# Patient Record
Sex: Male | Born: 1955 | Race: White | Hispanic: No | Marital: Married | Smoking: Never smoker
Health system: Southern US, Community
[De-identification: ages and names within clinical notes are randomized; demographics above are authoritative.]

## PROBLEM LIST (undated history)

## (undated) HISTORY — PX: HERNIA REPAIR: SHX51

## (undated) HISTORY — PX: SHOULDER ARTHROSCOPY W/ ROTATOR CUFF REPAIR: SHX2400

---

## 2008-05-29 ENCOUNTER — Ambulatory Visit: Payer: Self-pay | Admitting: Family Medicine

## 2014-09-06 ENCOUNTER — Encounter: Payer: Self-pay | Admitting: Emergency Medicine

## 2014-09-06 ENCOUNTER — Emergency Department: Payer: BC Managed Care – PPO

## 2014-09-06 ENCOUNTER — Emergency Department
Admission: EM | Admit: 2014-09-06 | Discharge: 2014-09-06 | Disposition: A | Discharge status: Home / Self Care | Payer: BC Managed Care – PPO | Attending: Emergency Medicine | Admitting: Emergency Medicine

## 2014-09-06 DIAGNOSIS — Y9389 Activity, other specified: Secondary | ICD-10-CM | POA: Insufficient documentation

## 2014-09-06 DIAGNOSIS — S93601A Unspecified sprain of right foot, initial encounter: Secondary | ICD-10-CM | POA: Diagnosis not present

## 2014-09-06 DIAGNOSIS — Y9289 Other specified places as the place of occurrence of the external cause: Secondary | ICD-10-CM | POA: Diagnosis not present

## 2014-09-06 DIAGNOSIS — S99921A Unspecified injury of right foot, initial encounter: Secondary | ICD-10-CM | POA: Diagnosis present

## 2014-09-06 DIAGNOSIS — Y998 Other external cause status: Secondary | ICD-10-CM | POA: Insufficient documentation

## 2014-09-06 DIAGNOSIS — X58XXXA Exposure to other specified factors, initial encounter: Secondary | ICD-10-CM | POA: Diagnosis not present

## 2014-09-06 MED ORDER — NAPROXEN 500 MG PO TABS: 500.0000 mg | ORAL_TABLET | Freq: Two times a day (BID) | ORAL | Status: AC

## 2014-09-06 NOTE — Discharge Instructions (Signed)
Foot Sprain The muscles and cord like structures which attach muscle to bone (tendons) that surround the feet are made up of units. A foot sprain can occur at the weakest spot in any of these units. This condition is most often caused by injury to or overuse of the foot, as from playing contact sports, or aggravating a previous injury, or from poor conditioning, or obesity. SYMPTOMS  Pain with movement of the foot.  Tenderness and swelling at the injury site.  Loss of strength is present in moderate or severe sprains. THE THREE GRADES OR SEVERITY OF FOOT SPRAIN ARE:  Mild (Grade I): Slightly pulled muscle without tearing of muscle or tendon fibers or loss of strength.  Moderate (Grade II): Tearing of fibers in a muscle, tendon, or at the attachment to bone, with small decrease in strength.  Severe (Grade III): Rupture of the muscle-tendon-bone attachment, with separation of fibers. Severe sprain requires surgical repair. Often repeating (chronic) sprains are caused by overuse. Sudden (acute) sprains are caused by direct injury or over-use. DIAGNOSIS  Diagnosis of this condition is usually by your own observation. If problems continue, a caregiver may be required for further evaluation and treatment. X-rays may be required to make sure there are not breaks in the bones (fractures) present. Continued problems may require physical therapy for treatment. PREVENTION  Use strength and conditioning exercises appropriate for your sport.  Warm up properly prior to working out.  Use athletic shoes that are made for the sport you are participating in.  Allow adequate time for healing. Early return to activities makes repeat injury more likely, and can lead to an unstable arthritic foot that can result in prolonged disability. Mild sprains generally heal in 3 to 10 days, with moderate and severe sprains taking 2 to 10 weeks. Your caregiver can help you determine the proper time required for  healing. HOME CARE INSTRUCTIONS   Apply ice to the injury for 15-20 minutes, 03-04 times per day. Put the ice in a plastic bag and place a towel between the bag of ice and your skin.  An elastic wrap (like an Ace bandage) may be used to keep swelling down.  Keep foot above the level of the heart, or at least raised on a footstool, when swelling and pain are present.  Try to avoid use other than gentle range of motion while the foot is painful. Do not resume use until instructed by your caregiver. Then begin use gradually, not increasing use to the point of pain. If pain does develop, decrease use and continue the above measures, gradually increasing activities that do not cause discomfort, until you gradually achieve normal use.  Use crutches if and as instructed, and for the length of time instructed.  Keep injured foot and ankle wrapped between treatments.  Massage foot and ankle for comfort and to keep swelling down. Massage from the toes up towards the knee.  Only take over-the-counter or prescription medicines for pain, discomfort, or fever as directed by your caregiver. SEEK IMMEDIATE MEDICAL CARE IF:   Your pain and swelling increase, or pain is not controlled with medications.  You have loss of feeling in your foot or your foot turns cold or blue.  You develop new, unexplained symptoms, or an increase of the symptoms that brought you to your caregiver. MAKE SURE YOU:   Understand these instructions.  Will watch your condition.  Will get help right away if you are not doing well or get worse. Document Released:   08/25/2001 Document Revised: 05/28/2011 Document Reviewed: 10/23/2007 Exit[employer] Patient Information 2015 [employer]. This information is not intended to replace advice given to you by your health care provider. Make sure you discuss any questions you have with your health care provider.  

## 2014-09-06 NOTE — ED Provider Notes (Signed)
[facility] Emergency Department Provider Note  ____________________________________________  Time seen: Approximately 8:22 AM  I have reviewed the triage vital signs and the nursing notes.   HISTORY  Chief Complaint Foot Pain    HPI Burwell Hoage is a 59 y.o. male plane of right foot pain secondary stepped in hole 2 days ago. Patient state bleeding might a broken foot secondary to his complaint of pain and swelling. Patient is rating his pain as a 3/10. Decreased ambulation.   No past medical history on file.  There are no active problems to display for this patient.   No past surgical history on file.  Current Outpatient Rx  Name  Route  Sig  Dispense  Refill  . loratadine (CLARITIN) 10 MG tablet   Oral   Take 10 mg by mouth daily.         . naproxen (NAPROSYN) 500 MG tablet   Oral   Take 1 tablet (500 mg total) by mouth 2 (two) times daily with a meal.   20 tablet   00     Allergies Tetracyclines & related  No family history on file.  Social History History  Substance Use Topics  . Smoking status: Never Smoker   . Smokeless tobacco: Never Used  . Alcohol Use: Yes    Review of Systems Constitutional: No fever/chills Eyes: No visual changes. ENT: No sore throat. Cardiovascular: Denies chest pain. Respiratory: Denies shortness of breath. Gastrointestinal: No abdominal pain.  No nausea, no vomiting.  No diarrhea.  No constipation. Genitourinary: Negative for dysuria. Musculoskeletal: Right lateral foot pain. Skin: Negative for rash. Neurological: Negative for headaches, focal weakness or numbness. 10-point ROS otherwise negative.  ____________________________________________   PHYSICAL EXAM:  VITAL SIGNS: ED Triage Vitals  Enc Vitals Group     BP 09/06/14 0724 141/81 mmHg     Pulse Rate 09/06/14 0724 84     Resp 09/06/14 0724 18     Temp 09/06/14 0724 97.4 F (36.3 C)     Temp Source 09/06/14 0724 Oral     SpO2  09/06/14 0724 98 %     Weight 09/06/14 0724 200 lb (90.719 kg)     Height 09/06/14 0724 5\' 11"  (1.803 m)     Head Cir --      Peak Flow --      Pain Score 09/06/14 0728 3     Pain Loc --      Pain Edu? --      Excl. in GC? --    Constitutional: Alert and oriented. Well appearing and in no acute distress. Eyes: Conjunctivae are normal. PERRL. EOMI. Head: Atraumatic. Nose: No congestion/rhinnorhea. Mouth/Throat: Mucous membranes are moist.  Oropharynx non-erythematous. Neck: No stridor.  Full nuchal range of motion no deformity nontender palpation. Hematological/Lymphatic/Immunilogical: No cervical lymphadenopathy. Cardiovascular: Normal rate, regular rhythm. Grossly normal heart sounds.  Good peripheral circulation. Respiratory: Normal respiratory effort.  No retractions. Lungs CTAB. Gastrointestinal: Soft and nontender. No distention. No abdominal bruits. No CVA tenderness. Musculoskeletal: No deformity to the right foot. Mild edema but no erythema for nuchal range of motion patient able to weight-bear. Neurologic:  Normal speech and language. No gross focal neurologic deficits are appreciated. Speech is normal. No gait instability. Skin:  Skin is warm, dry and intact. No rash noted. Psychiatric: Mood and affect are normal. Speech and behavior are normal.  ____________________________________________   LABS (all labs ordered are listed, but only abnormal results are displayed)  Labs Reviewed - No  data to display ____________________________________________  EKG   ____________________________________________  RADIOLOGY  No acute findings. ____________________________________________   PROCEDURES  Procedure(s) performed: None  Critical Care performed: No  ____________________________________________   INITIAL IMPRESSION / ASSESSMENT AND PLAN / ED COURSE  Pertinent labs & imaging results that were available during my care of the patient were reviewed by me and  considered in my medical decision making (see chart for details).  Sprain right foot. ____________________________________________   FINAL CLINICAL IMPRESSION(S) / ED DIAGNOSES  Final diagnoses:  Sprain of right foot, initial encounter      Joni Reining, PA-C 09/06/14 0831  Sharman Cheek, MD 09/06/14 [phone]

## 2014-09-06 NOTE — ED Notes (Signed)
Patient reports stepped in hole yesterday and believes he may have broken a bone in his foot, reports pain to outer portion of foot.

## 2016-08-27 ENCOUNTER — Encounter: Payer: Self-pay | Admitting: Emergency Medicine

## 2016-08-27 ENCOUNTER — Ambulatory Visit
Admission: EM | Admit: 2016-08-27 | Discharge: 2016-08-27 | Disposition: A | Discharge status: Home / Self Care | Payer: BC Managed Care – PPO | Attending: Family Medicine | Admitting: Family Medicine

## 2016-08-27 DIAGNOSIS — R69 Illness, unspecified: Secondary | ICD-10-CM

## 2016-08-27 DIAGNOSIS — R5383 Other fatigue: Secondary | ICD-10-CM

## 2016-08-27 DIAGNOSIS — J111 Influenza due to unidentified influenza virus with other respiratory manifestations: Secondary | ICD-10-CM

## 2016-08-27 DIAGNOSIS — R509 Fever, unspecified: Secondary | ICD-10-CM | POA: Diagnosis not present

## 2016-08-27 DIAGNOSIS — M791 Myalgia: Secondary | ICD-10-CM

## 2016-08-27 LAB — RAPID STREP SCREEN (MED CTR MEBANE ONLY): Streptococcus, Group A Screen (Direct): NEGATIVE

## 2016-08-27 MED ORDER — OSELTAMIVIR PHOSPHATE 75 MG PO CAPS: 75.0000 mg | ORAL_CAPSULE | Freq: Two times a day (BID) | ORAL | 0 refills | Status: DC

## 2016-08-27 MED ORDER — ONDANSETRON 8 MG PO TBDP: 8.0000 mg | ORAL_TABLET | Freq: Three times a day (TID) | ORAL | 0 refills | Status: AC | PRN

## 2016-08-27 NOTE — ED Triage Notes (Signed)
Patient states he developed a fever yesterday is achy today and no appetite.

## 2016-08-27 NOTE — ED Provider Notes (Signed)
[facility]    CSN: [id number] Arrival date & time: 08/27/16  0810     History   Chief Complaint Chief Complaint  Patient presents with  . Fever    HPI James Farmer is a 61 y.o. male.   The history is provided by the patient.  Fever  Associated symptoms: myalgias   URI  Presenting symptoms: fatigue and fever   Severity:  Moderate Onset quality:  Sudden Duration:  1 day Timing:  Constant Progression:  Worsening Chronicity:  New Relieved by:  OTC medications (tylenol) Worsened by:  Nothing Associated symptoms: myalgias   Risk factors: sick contacts   Risk factors: not elderly, no chronic cardiac disease, no chronic kidney disease, no chronic respiratory disease, no diabetes mellitus, no immunosuppression, no recent illness and no recent travel     History reviewed. No pertinent past medical history.  There are no active problems to display for this patient.   Past Surgical History:  Procedure Laterality Date  . HERNIA REPAIR    . SHOULDER ARTHROSCOPY W/ ROTATOR CUFF REPAIR         Home Medications    Prior to Admission medications   Medication Sig Start Date End Date Taking? Authorizing Provider  naproxen (NAPROSYN) 500 MG tablet Take 1 tablet (500 mg total) by mouth 2 (two) times daily with a meal. 09/06/14  Yes Joni Reining, PA-C  loratadine (CLARITIN) 10 MG tablet Take 10 mg by mouth daily.    [provider]  ondansetron (ZOFRAN ODT) 8 MG disintegrating tablet Take 1 tablet (8 mg total) by mouth every 8 (eight) hours as needed. 08/27/16   Payton Mccallum, MD  oseltamivir (TAMIFLU) 75 MG capsule Take 1 capsule (75 mg total) by mouth 2 (two) times daily. 08/27/16   Payton Mccallum, MD    Family History Family History  Problem Relation Age of Onset  . Heart failure Mother   . Stroke Father   . Cancer Neg Hx     Social History Social History  Substance Use Topics  . Smoking status: Never Smoker  . Smokeless tobacco: Never Used    . Alcohol use Yes     Allergies   Tetracyclines & related   Review of Systems Review of Systems  Constitutional: Positive for fatigue and fever.  Musculoskeletal: Positive for myalgias.     Physical Exam Triage Vital Signs ED Triage Vitals  Enc Vitals Group     BP 08/27/16 0904 120/72     Pulse Rate 08/27/16 0904 80     Resp 08/27/16 0904 18     Temp 08/27/16 0904 98 F (36.7 C)     Temp Source 08/27/16 0904 Oral     SpO2 08/27/16 0904 97 %     Weight 08/27/16 0907 204 lb 9.6 oz (92.8 kg)     Height 08/27/16 0907 5\' 11"  (1.803 m)     Head Circumference --      Peak Flow --      Pain Score 08/27/16 0907 2     Pain Loc --      Pain Edu? --      Excl. in GC? --    No data found.   Updated Vital Signs BP 120/72   Pulse 80   Temp 98 F (36.7 C) (Oral)   Resp 18   Ht 5\' 11"  (1.803 m)   Wt 204 lb 9.6 oz (92.8 kg)   SpO2 97%   BMI 28.54 kg/m   Visual  Acuity Right Eye Distance:   Left Eye Distance:   Bilateral Distance:    Right Eye Near:   Left Eye Near:    Bilateral Near:     Physical Exam  Constitutional: He appears well-developed and well-nourished. No distress.  HENT:  Head: Normocephalic and atraumatic.  Right Ear: Tympanic membrane, external ear and ear canal normal.  Left Ear: Tympanic membrane, external ear and ear canal normal.  Nose: Nose normal.  Mouth/Throat: Uvula is midline, oropharynx is clear and moist and mucous membranes are normal. No oropharyngeal exudate or tonsillar abscesses.  Eyes: Conjunctivae and EOM are normal. Pupils are equal, round, and reactive to light. Right eye exhibits no discharge. Left eye exhibits no discharge. No scleral icterus.  Neck: Normal range of motion. Neck supple. No tracheal deviation present. No thyromegaly present.  Cardiovascular: Normal rate, regular rhythm and normal heart sounds.   Pulmonary/Chest: Effort normal and breath sounds normal. No stridor. No respiratory distress. He has no wheezes. He has  no rales. He exhibits no tenderness.  Lymphadenopathy:    He has no cervical adenopathy.  Neurological: He is alert.  Skin: Skin is warm and dry. No rash noted. He is not diaphoretic.  Nursing note and vitals reviewed.    UC Treatments / Results  Labs (all labs ordered are listed, but only abnormal results are displayed) Labs Reviewed  RAPID STREP SCREEN (NOT AT [facility])  CULTURE, GROUP A STREP [facility])    EKG  EKG Interpretation None       Radiology No results found.  Procedures Procedures (including critical care time)  Medications Ordered in UC Medications - No data to display   Initial Impression / Assessment and Plan / UC Course  I have reviewed the triage vital signs and the nursing notes.  Pertinent labs & imaging results that were available during my care of the patient were reviewed by me and considered in my medical decision making (see chart for details).      Final Clinical Impressions(s) / UC Diagnoses   Final diagnoses:  Influenza-like illness    New Prescriptions Discharge Medication List as of 08/27/2016  9:43 AM    START taking these medications   Details  ondansetron (ZOFRAN ODT) 8 MG disintegrating tablet Take 1 tablet (8 mg total) by mouth every 8 (eight) hours as needed., Starting Mon 08/27/2016, Normal    oseltamivir (TAMIFLU) 75 MG capsule Take 1 capsule (75 mg total) by mouth 2 (two) times daily., Starting Mon 08/27/2016, Normal       1. Lab results and diagnosis reviewed with patient 2. rx as per orders above; reviewed possible side effects, interactions, risks and benefits  3. Recommend supportive treatment with rest, fluids 4. Follow-up prn if symptoms worsen or don't improve   Payton Mccallumonty, Kesley Mullens, MD 08/27/16 1014

## 2016-08-29 LAB — CULTURE, GROUP A STREP (THRC)

## 2020-08-06 ENCOUNTER — Encounter: Payer: Self-pay | Admitting: Emergency Medicine

## 2020-08-06 ENCOUNTER — Ambulatory Visit (INDEPENDENT_AMBULATORY_CARE_PROVIDER_SITE_OTHER): Payer: Medicare Other

## 2020-08-06 ENCOUNTER — Ambulatory Visit
Admission: EM | Admit: 2020-08-06 | Discharge: 2020-08-06 | Disposition: A | Discharge status: Home / Self Care | Payer: Medicare Other | Attending: Family Medicine | Admitting: Family Medicine

## 2020-08-06 ENCOUNTER — Other Ambulatory Visit: Payer: Self-pay

## 2020-08-06 DIAGNOSIS — R059 Cough, unspecified: Secondary | ICD-10-CM

## 2020-08-06 DIAGNOSIS — R062 Wheezing: Secondary | ICD-10-CM

## 2020-08-06 DIAGNOSIS — R0602 Shortness of breath: Secondary | ICD-10-CM | POA: Diagnosis not present

## 2020-08-06 MED ORDER — PROMETHAZINE-DM 6.25-15 MG/5ML PO SYRP: 5.0000 mL | ORAL_SOLUTION | Freq: Four times a day (QID) | ORAL | 0 refills | Status: AC | PRN

## 2020-08-06 MED ORDER — PREDNISONE 50 MG PO TABS: ORAL_TABLET | ORAL | 0 refills | Status: AC

## 2020-08-06 MED ORDER — ALBUTEROL SULFATE HFA 108 (90 BASE) MCG/ACT IN AERS: 1.0000 | INHALATION_SPRAY | Freq: Four times a day (QID) | RESPIRATORY_TRACT | 0 refills | Status: AC | PRN

## 2020-08-06 NOTE — ED Provider Notes (Signed)
[facility]    CSN: [id number] Arrival date & time: 08/06/20  1226      History   Chief Complaint Chief Complaint  Patient presents with  . Cough   HPI   65 year old male presents with the above complaint.  Patient reports that his symptoms started on Wednesday.  He has had cough and chest congestion.  He was seen on 5/19.  Had negative COVID and flu testing.  He was advised to go to the ER but did not do so.  Patient states that he is improved but is not at his baseline.  He states that he has had some shortness of breath but this has improved.  No fever.  He believes that this has been exacerbated by hayfever.  He has been taking Mucinex without resolution.  No other complaints.  Home Medications    Prior to Admission medications   Medication Sig Start Date End Date Taking? Authorizing Provider  albuterol (VENTOLIN HFA) 108 (90 Base) MCG/ACT inhaler Inhale 1-2 puffs into the lungs every 6 (six) hours as needed for wheezing or shortness of breath. 08/06/20  Yes Adriana Simas, Klaryssa Fauth G, DO  predniSONE (DELTASONE) 50 MG tablet 1 tablet daily x 5 days 08/06/20  Yes Lenix Kidd G, DO  promethazine-dextromethorphan (PROMETHAZINE-DM) 6.25-15 MG/5ML syrup Take 5 mLs by mouth 4 (four) times daily as needed for cough. 08/06/20  Yes Shamarie Call G, DO  naproxen (NAPROSYN) 500 MG tablet Take 1 tablet (500 mg total) by mouth 2 (two) times daily with a meal. 09/06/14   Joni Reining, PA-C  ondansetron (ZOFRAN ODT) 8 MG disintegrating tablet Take 1 tablet (8 mg total) by mouth every 8 (eight) hours as needed. 08/27/16   Payton Mccallum, MD  loratadine (CLARITIN) 10 MG tablet Take 10 mg by mouth daily.  08/06/20  [provider]    Family History Family History  Problem Relation Age of Onset  . Heart failure Mother   . Stroke Father   . Cancer Neg Hx     Social History Social History   Tobacco Use  . Smoking status: Never Smoker  . Smokeless tobacco: Never Used  Vaping Use  .  Vaping Use: Never used  Substance Use Topics  . Alcohol use: Yes  . Drug use: No     Allergies   Tetracyclines & related   Review of Systems Review of Systems  Constitutional: Negative for fever.  Respiratory: Positive for cough and shortness of breath.    Physical Exam Triage Vital Signs ED Triage Vitals  Enc Vitals Group     BP 08/06/20 1239 104/80     Pulse Rate 08/06/20 1239 76     Resp 08/06/20 1239 16     Temp 08/06/20 1239 98.2 F (36.8 C)     Temp Source 08/06/20 1239 Oral     SpO2 08/06/20 1239 95 %     Weight 08/06/20 1236 200 lb (90.7 kg)     Height 08/06/20 1236 5\' 11"  (1.803 m)     Head Circumference --      Peak Flow --      Pain Score 08/06/20 1236 5     Pain Loc --      Pain Edu? --      Excl. in GC? --    Updated Vital Signs BP 104/80 (BP Location: Left Arm)   Pulse 76   Temp 98.2 F (36.8 C) (Oral)   Resp 16   Ht 5\' 11"  (1.803  m)   Wt 90.7 kg   SpO2 95%   BMI 27.89 kg/m   Visual Acuity Right Eye Distance:   Left Eye Distance:   Bilateral Distance:    Right Eye Near:   Left Eye Near:    Bilateral Near:     Physical Exam Vitals and nursing note reviewed.  Constitutional:      General: He is not in acute distress.    Appearance: Normal appearance. He is not ill-appearing.  HENT:     Head: Normocephalic and atraumatic.  Eyes:     General:        Right eye: No discharge.        Left eye: No discharge.     Conjunctiva/sclera: Conjunctivae normal.  Cardiovascular:     Rate and Rhythm: Normal rate and regular rhythm.  Pulmonary:     Effort: Pulmonary effort is normal. No respiratory distress.     Breath sounds: Wheezing present.  Neurological:     Mental Status: He is alert.  Psychiatric:        Mood and Affect: Mood normal.        Behavior: Behavior normal.    UC Treatments / Results  Labs (all labs ordered are listed, but only abnormal results are displayed) Labs Reviewed - No data to display  EKG   Radiology DG  Chest 2 View  Result Date: 08/06/2020 CLINICAL DATA:  Cough shortness of breath and chest congestion. EXAM: CHEST - 2 VIEW COMPARISON:  Non FINDINGS: Trachea midline. Cardiomediastinal contours and hilar structures are normal. Lungs are clear. No sign of pleural effusion. On limited assessment no acute skeletal process. IMPRESSION: No active cardiopulmonary disease. Electronically Signed   By: Donzetta Kohut M.D.   On: 08/06/2020 13:43    Procedures Procedures (including critical care time)  Medications Ordered in UC Medications - No data to display  Initial Impression / Assessment and Plan / UC Course  I have reviewed the triage vital signs and the nursing notes.  Pertinent labs & imaging results that were available during my care of the patient were reviewed by me and considered in my medical decision making (see chart for details).    65 year old male presents with wheezing and cough.  He has had some shortness of breath as well.  Exam notable for wheezing and coarse breath sounds.  Chest x-ray was obtained and was independent reviewed by me.  Interpretation: No evidence of pneumonia.  No acute findings.  Treating with albuterol, prednisone, and Promethazine DM.  Final Clinical Impressions(s) / UC Diagnoses   Final diagnoses:  Wheezing  Cough     Discharge Instructions     Chest xray was clear.  Medications as prescribed.  Take care  Dr. Adriana Simas    ED Prescriptions    Medication Sig Dispense Auth. Provider   albuterol (VENTOLIN HFA) 108 (90 Base) MCG/ACT inhaler Inhale 1-2 puffs into the lungs every 6 (six) hours as needed for wheezing or shortness of breath. 18 g Lauralynn Loeb G, DO   predniSONE (DELTASONE) 50 MG tablet 1 tablet daily x 5 days 5 tablet Shem Plemmons G, DO   promethazine-dextromethorphan (PROMETHAZINE-DM) 6.25-15 MG/5ML syrup Take 5 mLs by mouth 4 (four) times daily as needed for cough. 118 mL Tommie Sams, DO     PDMP not reviewed this encounter.   Tommie Sams, [place] 08/06/20 1537

## 2020-08-06 NOTE — ED Triage Notes (Signed)
Patient c/o cough and chest congestion that started Wed night.  Patient states that he went to the Urgent Care in [facility] and was negative for flu and covid.  Patient denies fevers.

## 2020-08-06 NOTE — Discharge Instructions (Signed)
Chest xray was clear.  Medications as prescribed.  Take care  Dr. Adriana Simas

## 2022-03-03 ENCOUNTER — Ambulatory Visit
Admission: RE | Admit: 2022-03-03 | Discharge: 2022-03-03 | Disposition: A | Discharge status: Home / Self Care | Payer: Medicare Other | Source: Ambulatory Visit | Attending: Family Medicine | Admitting: Family Medicine

## 2022-03-03 NOTE — ED Notes (Signed)
Call pt x 2 in lobby no answer Pt called on phone no answer

## 2022-09-28 ENCOUNTER — Ambulatory Visit
Admission: EM | Admit: 2022-09-28 | Discharge: 2022-09-28 | Disposition: A | Discharge status: Home / Self Care | Payer: Medicare Other | Attending: Family Medicine | Admitting: Family Medicine

## 2022-09-28 DIAGNOSIS — H6123 Impacted cerumen, bilateral: Secondary | ICD-10-CM | POA: Diagnosis not present

## 2022-09-28 NOTE — ED Triage Notes (Signed)
Pt present bilateral ear fullness, pt states tried otc medication for relief with no help. Symptoms started a week ago. Pt denies any pain just discomfort.

## 2022-09-28 NOTE — Discharge Instructions (Addendum)
Schedule an appointment with Dr. Willeen Cass who is on-call [facility]  Stop by the pharmacy to pick up Debrox for earwax removal.

## 2022-09-28 NOTE — ED Provider Notes (Signed)
[facility]    CSN: [id number] Arrival date & time: 09/28/22  4098      History   Chief Complaint Chief Complaint  Patient presents with   Ear Fullness    HPI James Farmer is a 67 y.o. male.   HPI   James Farmer presents for bilateral ear discomfort with associated blockage, fullness, hearing loss, and pressure.  He has been swimming recently.  He put some swimmers ear afterwards without relief.  They at the gym everyone had to keep repeating themselves therefore he came in.  Notes that his wife tried to get him in the last couple of days.   Antwoin has otherwise been well and has no other concerns.       History reviewed. No pertinent past medical history.  There are no problems to display for this patient.   Past Surgical History:  Procedure Laterality Date   HERNIA REPAIR     SHOULDER ARTHROSCOPY W/ ROTATOR CUFF REPAIR         Home Medications    Prior to Admission medications   Medication Sig Start Date End Date Taking? Authorizing Provider  albuterol (VENTOLIN HFA) 108 (90 Base) MCG/ACT inhaler Inhale 1-2 puffs into the lungs every 6 (six) hours as needed for wheezing or shortness of breath. 08/06/20   Tommie Sams, DO  naproxen (NAPROSYN) 500 MG tablet Take 1 tablet (500 mg total) by mouth 2 (two) times daily with a meal. 09/06/14   Joni Reining, PA-C  ondansetron (ZOFRAN ODT) 8 MG disintegrating tablet Take 1 tablet (8 mg total) by mouth every 8 (eight) hours as needed. 08/27/16   Payton Mccallum, MD  predniSONE (DELTASONE) 50 MG tablet 1 tablet daily x 5 days 08/06/20   Tommie Sams, DO  promethazine-dextromethorphan (PROMETHAZINE-DM) 6.25-15 MG/5ML syrup Take 5 mLs by mouth 4 (four) times daily as needed for cough. 08/06/20   Tommie Sams, DO  loratadine (CLARITIN) 10 MG tablet Take 10 mg by mouth daily.  08/06/20  [provider]    Family History Family History  Problem Relation Age of Onset   Heart failure Mother    Stroke Father     Cancer Neg Hx     Social History Social History   Tobacco Use   Smoking status: Never   Smokeless tobacco: Never  Vaping Use   Vaping status: Never Used  Substance Use Topics   Alcohol use: Yes   Drug use: No     Allergies   Tetracyclines & related   Review of Systems Review of Systems: :negative unless otherwise stated in HPI.      Physical Exam Triage Vital Signs ED Triage Vitals  Encounter Vitals Group     BP 09/28/22 0831 126/82     Systolic BP Percentile --      Diastolic BP Percentile --      Pulse Rate 09/28/22 0831 89     Resp 09/28/22 0831 18     Temp 09/28/22 0831 98 F (36.7 C)     Temp Source 09/28/22 0831 Oral     SpO2 09/28/22 0831 98 %     Weight --      Height --      Head Circumference --      Peak Flow --      Pain Score 09/28/22 0832 0     Pain Loc --      Pain Education --      Exclude from Growth Chart --  No data found.  Updated Vital Signs BP 126/82 (BP Location: Left Arm)   Pulse 89   Temp 98 F (36.7 C) (Oral)   Resp 18   SpO2 98%   Visual Acuity Right Eye Distance:   Left Eye Distance:   Bilateral Distance:    Right Eye Near:   Left Eye Near:    Bilateral Near:     Physical Exam GEN:     alert, well appearing male in no distress   HENT:  mucus membranes moist, clear nasal discharge, right and left TM not visible 2/2 cerumen impaction, non-tender tragus EYES:   no scleral injection or discharge NECK:  normal ROM, no lymphadenopathy, no meningismus   RESP:  no increased work of breathing Skin:   warm and dry, no rash on external ear    UC Treatments / Results  Labs (all labs ordered are listed, but only abnormal results are displayed) Labs Reviewed - No data to display  EKG   Radiology No results found.  Procedures Procedures (including critical care time) Procedures Ear Cerumen Removal   Date/Time: 03/02/2019 9:08 PM Performed by: nursing staff Authorized by: Katha Cabal, DO Consent:     Consent obtained:  Verbal   Consent given by:  Patient   Risks discussed:  Bleeding, dizziness, infection, incomplete removal, TM perforation and pain   Alternatives discussed:  No treatment Procedure details:    Location:  Left and right ear   Procedure type: irrigation   Post-procedure details:    Inspection:  TM still not visible after 4 rounds of irrigation and 2 rounds of manual removal performed by me   Hearing quality:  Improved but not totally clear as he was    Patient tolerance of procedure:  Tolerated well, no immediate complications   Medications Ordered in UC Medications - No data to display  Initial Impression / Assessment and Plan / UC Course  I have reviewed the triage vital signs and the nursing notes.  Pertinent labs & imaging results that were available during my care of the patient were reviewed by me and considered in my medical decision making (see chart for details).    Pt is a 67 y.o. male with about a week of hearing  loss and ear discomfort after recently swimming. Ceruminosis is noted bilaterally.  Wax was removed by syringing and manual debridement.  On reassessment, TM still not visible after several rounds of irrigation and manual removal. Hearing has improved but not clear as it was before. Instructions for home care to prevent wax buildup are given. Pt to follow up with Dr Willeen Cass, ENT on-call for [facility].   Discussed MDM, treatment plan and plan for follow-up with patient who agrees with plan.   Final Clinical Impressions(s) / UC Diagnoses   Final diagnoses:  Bilateral hearing loss due to cerumen impaction     Discharge Instructions      Schedule an appointment with Dr. Willeen Cass who is on-call [facility]  Stop by the pharmacy to pick up Debrox for earwax removal.        ED Prescriptions   None    PDMP not reviewed this encounter.   Katha Cabal, DO 09/28/22 [phone]

## 2023-05-20 IMAGING — CR DG CHEST 2V
2 series · 2 of 2 positions shown · non-contrast
Comparison: Non

CLINICAL DATA: Cough shortness of breath and chest congestion.

EXAM:
CHEST - 2 VIEW

[chest pa]
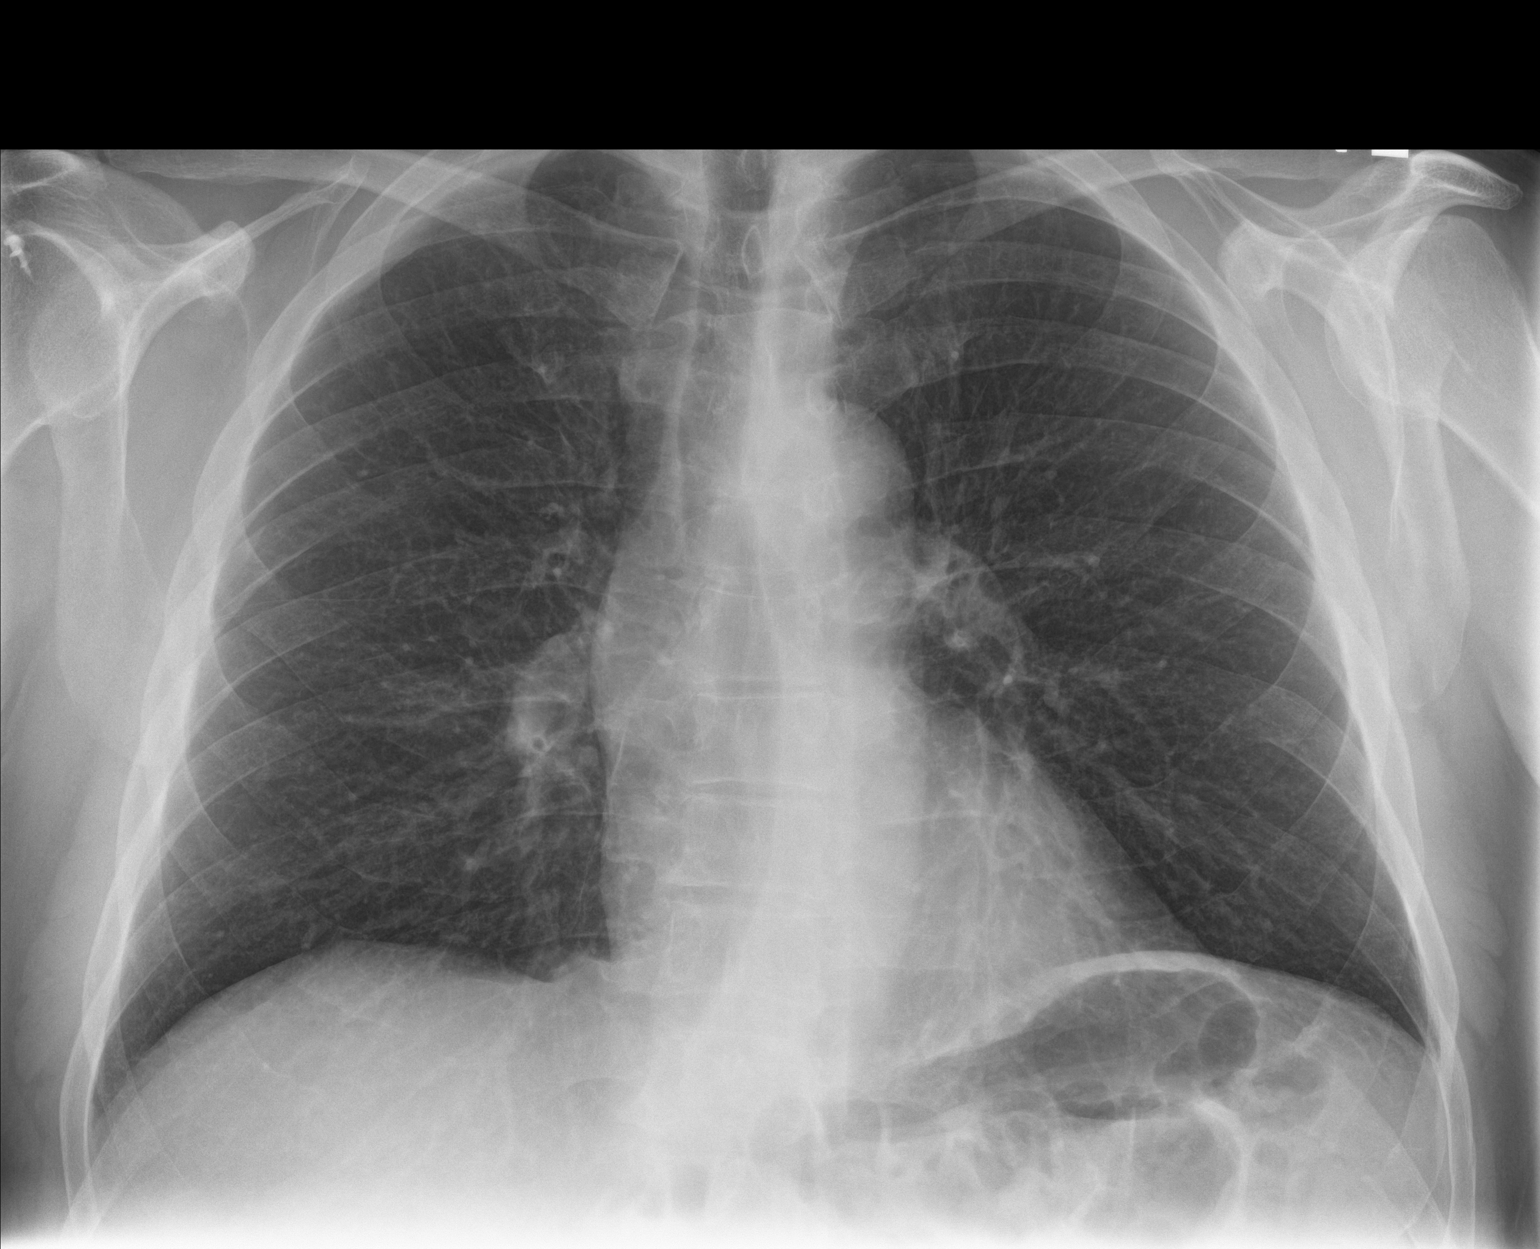

[chest lat]
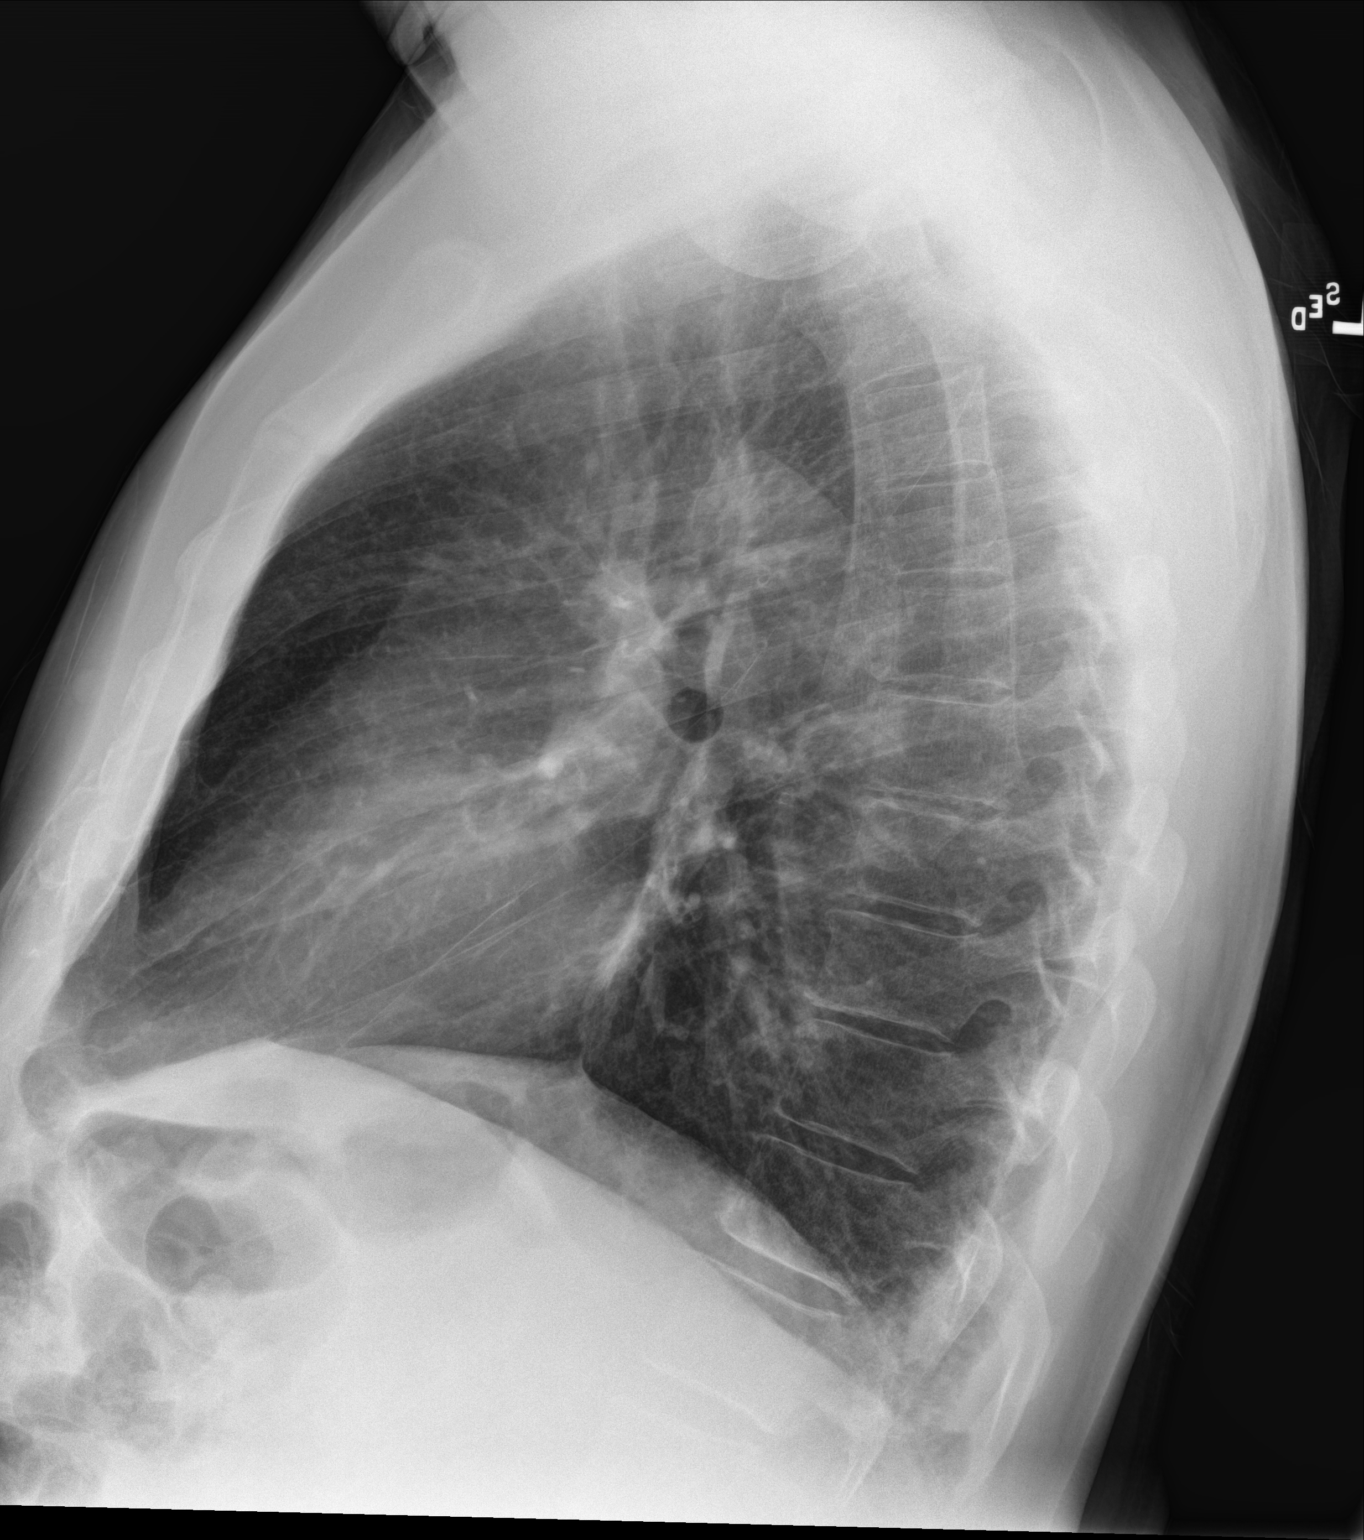

[2 of 2 positions shown; findings below may reference images not displayed]

FINDINGS: Trachea midline. Cardiomediastinal contours and hilar structures are
normal.

Lungs are clear.

No sign of pleural effusion.

On limited assessment no acute skeletal process.
IMPRESSION: No active cardiopulmonary disease.
